# Patient Record
Sex: Female | Born: 1955 | Race: Black or African American | Hispanic: No | Marital: Single | Smoking: Never smoker
Health system: Southern US, Community
[De-identification: ages and names within clinical notes are randomized; demographics above are authoritative.]

---

## 2017-05-26 ENCOUNTER — Encounter (HOSPITAL_COMMUNITY): Payer: Self-pay | Admitting: Emergency Medicine

## 2017-05-26 ENCOUNTER — Emergency Department (HOSPITAL_COMMUNITY): Payer: Medicaid Other

## 2017-05-26 ENCOUNTER — Emergency Department (HOSPITAL_COMMUNITY)
Admission: EM | Admit: 2017-05-26 | Discharge: 2017-05-26 | Disposition: A | Discharge status: Home / Self Care | Payer: Medicaid Other | Attending: Emergency Medicine | Admitting: Emergency Medicine

## 2017-05-26 DIAGNOSIS — S40022A Contusion of left upper arm, initial encounter: Secondary | ICD-10-CM | POA: Diagnosis not present

## 2017-05-26 DIAGNOSIS — Y999 Unspecified external cause status: Secondary | ICD-10-CM | POA: Diagnosis not present

## 2017-05-26 DIAGNOSIS — Y929 Unspecified place or not applicable: Secondary | ICD-10-CM | POA: Insufficient documentation

## 2017-05-26 DIAGNOSIS — M545 Low back pain: Secondary | ICD-10-CM | POA: Insufficient documentation

## 2017-05-26 DIAGNOSIS — Y9389 Activity, other specified: Secondary | ICD-10-CM | POA: Insufficient documentation

## 2017-05-26 DIAGNOSIS — Z9114 Patient's other noncompliance with medication regimen: Secondary | ICD-10-CM | POA: Insufficient documentation

## 2017-05-26 DIAGNOSIS — I10 Essential (primary) hypertension: Secondary | ICD-10-CM | POA: Insufficient documentation

## 2017-05-26 DIAGNOSIS — M25512 Pain in left shoulder: Secondary | ICD-10-CM | POA: Diagnosis present
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
## 2017-05-26 NOTE — Discharge Instructions (Signed)
Take Tylenol as directed for pain. Your blood pressure today was elevated at 171/100. Get your blood pressure medication at your local pharmacy today and take your medications as prescribed. Your blood pressure should be rechecked at your doctor's office in one week. Return if concern for any reason
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
